# Patient Record
Sex: Male | Born: 1970 | Marital: Married | Smoking: Never smoker
Health system: Southern US, Community
[De-identification: ages and names within clinical notes are randomized; demographics above are authoritative.]

## PROBLEM LIST (undated history)

## (undated) DIAGNOSIS — I10 Essential (primary) hypertension: Secondary | ICD-10-CM

## (undated) DIAGNOSIS — L409 Psoriasis, unspecified: Secondary | ICD-10-CM

## (undated) HISTORY — DX: Psoriasis, unspecified: L40.9

## (undated) HISTORY — DX: Essential (primary) hypertension: I10

---

## 2019-02-26 DIAGNOSIS — I219 Acute myocardial infarction, unspecified: Secondary | ICD-10-CM

## 2019-02-26 HISTORY — DX: Acute myocardial infarction, unspecified: I21.9

## 2019-11-03 ENCOUNTER — Ambulatory Visit
Admission: RE | Admit: 2019-11-03 | Discharge: 2019-11-03 | Disposition: A | Discharge status: Home / Self Care | Payer: Self-pay | Source: Ambulatory Visit | Attending: Cardiothoracic Surgery | Admitting: Cardiothoracic Surgery

## 2019-11-03 ENCOUNTER — Other Ambulatory Visit: Payer: Self-pay | Admitting: Cardiothoracic Surgery

## 2019-11-03 ENCOUNTER — Ambulatory Visit: Payer: BC Managed Care – PPO | Admitting: Cardiothoracic Surgery

## 2019-11-03 ENCOUNTER — Encounter: Payer: Self-pay | Admitting: Cardiothoracic Surgery

## 2019-11-03 ENCOUNTER — Other Ambulatory Visit: Payer: Self-pay

## 2019-11-03 DIAGNOSIS — R079 Chest pain, unspecified: Secondary | ICD-10-CM

## 2019-11-03 DIAGNOSIS — C419 Malignant neoplasm of bone and articular cartilage, unspecified: Secondary | ICD-10-CM

## 2019-11-03 NOTE — Patient Instructions (Addendum)
Dr.Oaks informed patient that we will have the outside discs scan into our EHR. He also informed patient that we would have our Radiologist review both of patient's scans and have them mailed out to patient.   Patient will contact our office Friday to go over results with Dr.Oaks.

## 2019-11-03 NOTE — Progress Notes (Signed)
Patient ID: Kartier Ater, male   DOB: 28-Feb-1971, 49 y.o.   MRN: [id number]  Chief Complaint  Patient presents with  . New Patient (Initial Visit)    New patient referred by Dr. 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 Carlye Grippe Reason for Referral palpable mass left chest wall  HPI Location, Quality, Duration, Severity, Timing, Context, Modifying Factors, Associated Signs and Symptoms.  Sabastion Dean is a 49 y.o. male.  This patient is known to me from many years ago.  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  He was then treated with chemotherapy postoperatively and has been following up with his primary care physician in [place].  He states that in June of this year he had a myocardial infarction and underwent stent placement.  At the time he began noticing a small lump over his incision on his left anterior chest wall.  He was not sure if this was because of his increased awareness of his overall health or whether or not this was actually present.  Its not associated with any tenderness at this time.  He thinks that it might of gotten a little bit larger and he had another CT scan done.  That CT scan showed some dystrophic calcifications in the surgical bed of the second rib resection.  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  The patient had a CT scan performed back in 2009 at [facility] and he did have those brought to [place] today.  I compared the x-rays from 2009 to the x-rays from 2021.  Essentially there is no change in the left anterior chest wall findings.  I see nothing to suggest a recurrence.   Past Medical History:  Diagnosis Date  . Heart attack ([place]) 02/26/2019  . Hypertension   . Psoriasis     History reviewed. No pertinent surgical history.  Family History  Problem Relation Age of Onset  . Cancer  Mother        Breast Cancer  . Diabetes Father   . Psoriasis Father   . Atrial fibrillation Brother     Social History Social History   Tobacco Use  . Smoking status: Never Smoker  . Smokeless tobacco: Never Used  Substance Use Topics  . Alcohol use: Yes  . Drug use: Never    No Known Allergies  Current Outpatient Medications  Medication Sig Dispense Refill  . Adalimumab (HUMIRA PEN) 40 MG/0.8ML PNKT Inject into the skin.    Marland Kitchen aspirin 81 MG EC tablet Take by mouth.    Marland Kitchen atorvastatin (LIPITOR) 40 MG tablet Take 40 mg by mouth at bedtime.    . Betamethasone Valerate 0.12 % foam APPLY TO AFFECTED AREAS TWICE A DAY AS NEEDED    . hydrochlorothiazide (HYDRODIURIL) 25 MG tablet Take by mouth.    . metoprolol tartrate (LOPRESSOR) 25 MG tablet Take by mouth.    . ticagrelor (BRILINTA) 90 MG TABS tablet Take by mouth.     No current facility-administered medications for this visit.      Review of Systems A complete review of systems was asked and was negative except for the following positive findings occasional chest pain overlying the palpable deformity which she thinks might be secondary to him pressing on the area.  There were no vitals taken for this visit.  Physical Exam CONSTITUTIONAL:  Pleasant, well-developed, well-nourished, and in no acute distress. EYES: Pupils equal and reactive to light, Sclera non-icteric EARS, NOSE, MOUTH AND THROAT:  The oropharynx was clear.  Dentition is good repair.  Oral mucosa pink and moist. LYMPH NODES:  Lymph nodes in the neck and axillae were normal RESPIRATORY:  Lungs were clear.  Normal respiratory effort without pathologic use of accessory muscles of respiration CARDIOVASCULAR: Heart was regular without murmurs.  There were no carotid bruits. GI: The abdomen was soft, nontender, and nondistended. There were no palpable masses. There was no hepatosplenomegaly. There were normal bowel sounds in all quadrants. GU:  Rectal deferred.    MUSCULOSKELETAL:  Normal muscle strength and tone.  No clubbing or cyanosis.  There is a small lung hernia anteriorly on the left just above the incision.  The left breast is slightly deformed from the incision.  There is no palpable abnormality present. SKIN:  There were no pathologic skin lesions.  There were no nodules on palpation. NEUROLOGIC:  Sensation is normal.  Cranial nerves are grossly intact. PSYCH:  Oriented to person, place and time.  Mood and affect are normal.  Data Reviewed CT scan from 2009 2021  I have personally reviewed the patient's imaging, laboratory findings and medical records.    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  I would like to take his 2 sets of x-rays over to our radiology department and have them digitized and read.  I was able to review some of the films today on my personal computer however the images were slow to load and difficult to manipulate.  The patient will contact me later this week.  We will then return his discs to him.       Nestor Lewandowsky, MD 11/03/2019, 2:13 PM

## 2019-11-06 ENCOUNTER — Ambulatory Visit
Admission: RE | Admit: 2019-11-06 | Discharge: 2019-11-06 | Disposition: A | Discharge status: Home / Self Care | Payer: Self-pay | Source: Ambulatory Visit | Attending: Cardiothoracic Surgery | Admitting: Cardiothoracic Surgery

## 2019-11-06 ENCOUNTER — Other Ambulatory Visit: Payer: Self-pay | Admitting: Cardiothoracic Surgery

## 2019-11-06 DIAGNOSIS — R079 Chest pain, unspecified: Secondary | ICD-10-CM
